# Patient Record
Sex: Female | Born: 1975 | Race: White | Hispanic: No | Marital: Married | Smoking: Never smoker
Health system: Southern US, Community
[De-identification: ages and names within clinical notes are randomized; demographics above are authoritative.]

## PROBLEM LIST (undated history)

## (undated) DIAGNOSIS — D509 Iron deficiency anemia, unspecified: Secondary | ICD-10-CM

## (undated) HISTORY — PX: ABDOMINAL HYSTERECTOMY: SHX81

## (undated) HISTORY — PX: TONSILLECTOMY: SUR1361

---

## 2001-03-27 ENCOUNTER — Other Ambulatory Visit: Admission: RE | Admit: 2001-03-27 | Discharge: 2001-03-27 | Payer: Self-pay | Admitting: Gynecology

## 2003-07-17 ENCOUNTER — Other Ambulatory Visit: Admission: RE | Admit: 2003-07-17 | Discharge: 2003-07-17 | Payer: Self-pay | Admitting: Gynecology

## 2016-03-03 ENCOUNTER — Emergency Department (HOSPITAL_BASED_OUTPATIENT_CLINIC_OR_DEPARTMENT_OTHER): Payer: BLUE CROSS/BLUE SHIELD

## 2016-03-03 ENCOUNTER — Emergency Department (HOSPITAL_BASED_OUTPATIENT_CLINIC_OR_DEPARTMENT_OTHER)
Admission: EM | Admit: 2016-03-03 | Discharge: 2016-03-03 | Disposition: A | Discharge status: Home / Self Care | Payer: BLUE CROSS/BLUE SHIELD | Attending: Emergency Medicine | Admitting: Emergency Medicine

## 2016-03-03 ENCOUNTER — Encounter (HOSPITAL_BASED_OUTPATIENT_CLINIC_OR_DEPARTMENT_OTHER): Payer: Self-pay

## 2016-03-03 DIAGNOSIS — M25532 Pain in left wrist: Secondary | ICD-10-CM

## 2016-03-03 DIAGNOSIS — Z79899 Other long term (current) drug therapy: Secondary | ICD-10-CM | POA: Insufficient documentation

## 2016-03-03 HISTORY — DX: Iron deficiency anemia, unspecified: D50.9

## 2016-03-03 MED ORDER — IBUPROFEN 800 MG PO TABS: 800.0000 mg | ORAL_TABLET | Freq: Once | ORAL | Status: DC

## 2016-03-03 MED ORDER — OXYCODONE HCL 5 MG PO TABS: 5.0000 mg | ORAL_TABLET | Freq: Once | ORAL | Status: DC

## 2016-03-03 MED ORDER — ACETAMINOPHEN 500 MG PO TABS: 1000.0000 mg | ORAL_TABLET | Freq: Once | ORAL | Status: DC

## 2016-03-03 NOTE — Discharge Instructions (Signed)
You have pain over the scaphoid bone. Sometimes this can be fractured and hard to see on x-ray. Follow-up with a hand surgeon in one week.  Take 4 over the counter ibuprofen tablets 3 times a day or 2 over-the-counter naproxen tablets twice a day for pain. Also take tylenol 1000mg (2 extra strength) four times a day.

## 2016-03-03 NOTE — ED Notes (Signed)
No changes, pt declined pain meds, states, "did not need at this time, have meds at home", "ready to go", pending splint, family at [facility] x2, denies questions or needs.

## 2016-03-03 NOTE — ED Notes (Signed)
CMS intact before and after. Pt tolerated well. Pt had no questions.  

## 2016-03-03 NOTE — ED Provider Notes (Signed)
[facility] Provider Note   CSN: [id number] Arrival date & time: 03/03/16  4098  First Provider Contact:  First MD Initiated Contact with Patient 03/03/16 2104     By signing my name below, I, Soijett Blue, attest that this documentation has been prepared under the direction and in the presence of Melene Plan, DO. Electronically Signed: Soijett Blue, ED Scribe. 03/03/16. 9:17 PM.   History   Chief Complaint Chief Complaint  Patient presents with  . Arm Pain    HPI Elizabeth Strickland is a 40 y.o. female who presents to the Emergency Department complaining of left arm pain onset 5 hours ago. Pt notes that she was in the [place] airport when she slipped and fell on a wet floor onto her left side. Pt is having associated symptoms of left hand pain, left wrist pain/swelling, and left forearm pain. She notes that she has tried ice and ibuprofen with no relief of her symptoms. She denies hitting her head, LOC, color change, wound, rash, and any other symptoms.    The history is provided by the patient. No language interpreter was used.  Arm Pain  This is a new problem. The current episode started 3 to 5 hours ago. The problem occurs rarely. The problem has not changed since onset.Pertinent negatives include no chest pain, no headaches and no shortness of breath. The symptoms are aggravated by bending. Nothing relieves the symptoms. She has tried a cold compress (ibuprofen) for the symptoms. The treatment provided no relief.    Past Medical History:  Diagnosis Date  . Iron (Fe) deficiency anemia     There are no active problems to display for this patient.   Past Surgical History:  Procedure Laterality Date  . ABDOMINAL HYSTERECTOMY    . TONSILLECTOMY      OB History    No data available       Home Medications    Prior to Admission medications   Medication Sig Start Date End Date Taking? Authorizing Provider  ferrous sulfate 325 (65 FE) MG tablet Take 325 mg by mouth  daily with breakfast.   Yes Historical Provider, MD  Nutritional Supplements (VITAMIN D BOOSTER PO) Take by mouth.   Yes Historical Provider, MD    Family History History reviewed. No pertinent family history.  Social History Social History  Substance Use Topics  . Smoking status: Never Smoker  . Smokeless tobacco: Never Used  . Alcohol use No     Allergies   Codeine   Review of Systems Review of Systems  Constitutional: Negative for chills and fever.  HENT: Negative for congestion and rhinorrhea.   Eyes: Negative for redness and visual disturbance.  Respiratory: Negative for shortness of breath and wheezing.   Cardiovascular: Negative for chest pain and palpitations.  Gastrointestinal: Negative for nausea and vomiting.  Genitourinary: Negative for dysuria and urgency.  Musculoskeletal: Positive for arthralgias (left hand, left wrist, and left forearm) and joint swelling (left wrist). Negative for myalgias.  Skin: Negative for color change, pallor, rash and wound.  Neurological: Negative for dizziness and headaches.  All other systems reviewed and are negative.    Physical Exam Updated Vital Signs BP (!) 112/53 (BP Location: Right Arm)   Pulse 76   Temp 98.3 F (36.8 C) (Oral)   Resp 16   Ht 5\' 6"  (1.676 m)   Wt 200 lb (90.7 kg)   SpO2 100%   BMI 32.28 kg/m   Physical Exam  Constitutional: She is oriented  to person, place, and time. She appears well-developed and well-nourished. No distress.  HENT:  Head: Normocephalic and atraumatic.  Eyes: EOM are normal. Pupils are equal, round, and reactive to light.  Neck: Normal range of motion. Neck supple.  Cardiovascular: Normal rate.   Pulmonary/Chest: Effort normal.  Abdominal: She exhibits no distension.  Musculoskeletal: She exhibits no edema.       Left wrist: She exhibits tenderness and swelling.  Positive Finkelstein test. Tenderness about the left extensor pollicis brevis ligament. Tenderness about the  snuff box with mild swelling. Pulse motor and sensation intact distally.   Neurological: She is alert and oriented to person, place, and time.  Skin: Skin is warm and dry. She is not diaphoretic.  Psychiatric: She has a normal mood and affect. Her behavior is normal.  Nursing note and vitals reviewed.    ED Treatments / Results  DIAGNOSTIC STUDIES: Oxygen Saturation is 100% on RA, nl by my interpretation.    COORDINATION OF CARE: 9:06 PM Discussed treatment plan with pt at bedside which includes left forearm xray, left wrist xray, left hand xray, splint, referral and follow up with hand surgeon, and pt agreed to plan.  Radiology Dg Forearm Left  Result Date: 03/03/2016 CLINICAL DATA:  Fall, pain and swelling to lateral aspect of left wrist and base of thumb. EXAM: LEFT FOREARM - 2 VIEW COMPARISON:  None. FINDINGS: Osseous alignment is normal. Bone mineralization is normal. No fracture line or displaced fracture fragment identified. Adjacent soft tissues are unremarkable. IMPRESSION: Negative. Electronically Signed   By: Bary Richard M.D.   On: 03/03/2016 20:39  Dg Wrist Complete Left  Result Date: 03/03/2016 CLINICAL DATA:  Fall, pain and swelling to lateral aspect of left wrist and base of thumb. EXAM: LEFT WRIST - COMPLETE 3+ VIEW COMPARISON:  None. FINDINGS: Osseous alignment is normal. Bone mineralization is normal. No fracture line or displaced fracture fragment identified. Adjacent soft tissues are unremarkable. IMPRESSION: Negative. Electronically Signed   By: Bary Richard M.D.   On: 03/03/2016 20:37  Dg Hand Complete Left  Result Date: 03/03/2016 CLINICAL DATA:  Fall, pain and swelling to lateral aspect of left wrist and base of thumb. EXAM: LEFT HAND - COMPLETE 3+ VIEW COMPARISON:  None. FINDINGS: Osseous alignment is normal. Bone mineralization is normal. No fracture line or displaced fracture fragment identified. Soft tissues about the right hand are unremarkable. IMPRESSION:  Negative. Electronically Signed   By: Bary Richard M.D.   On: 03/03/2016 20:38   Procedures Procedures (including critical care time)  Medications Ordered in ED Medications  acetaminophen (TYLENOL) tablet 1,000 mg (1,000 mg Oral Refused 03/03/16 2134)  ibuprofen (ADVIL,MOTRIN) tablet 800 mg (800 mg Oral Refused 03/03/16 2134)  oxyCODONE (Oxy IR/ROXICODONE) immediate release tablet 5 mg (5 mg Oral Refused 03/03/16 2134)     Initial Impression / Assessment and Plan / ED Course  I have reviewed the triage vital signs and the nursing notes.  Pertinent imaging results that were available during my care of the patient were reviewed by me and considered in my medical decision making (see chart for details).  Clinical Course    40 yo F With a chief complaint of a fall on outstretched hand. [place] Tablet from standing. Patient having some significant pain to the wrist. The patient's area of pain is most likely inflammation of the EPL, however the patient also has snuffbox tenderness and swelling. We'll place her in a thumb spica  for follow-up with hand surgery.  10:34 PM:  I have discussed the diagnosis/risks/treatment options with the patient and believe the pt to be eligible for discharge home to follow-up with Hand surgery. We also discussed returning to the ED immediately if new or worsening sx occur. We discussed the sx which are most concerning (e.g., sudden worsening pain) that necessitate immediate return. Medications administered to the patient during their visit and any new prescriptions provided to the patient are listed below.  Medications given during this visit Medications  acetaminophen (TYLENOL) tablet 1,000 mg (1,000 mg Oral Refused 03/03/16 2134)  ibuprofen (ADVIL,MOTRIN) tablet 800 mg (800 mg Oral Refused 03/03/16 2134)  oxyCODONE (Oxy IR/ROXICODONE) immediate release tablet 5 mg (5 mg Oral Refused 03/03/16 2134)     The patient appears  reasonably screen and/or stabilized for discharge and I doubt any other medical condition or other [facility] requiring further screening, evaluation, or treatment in the ED at this time prior to discharge.    Final Clinical Impressions(s) / ED Diagnoses   Final diagnoses:  Left wrist pain    New Prescriptions Discharge Medication List as of 03/03/2016  9:19 PM      I personally performed the services described in this documentation, which was scribed in my presence. The recorded information has been reviewed and is accurate.     Melene Plan, DO 03/03/16 2234

## 2016-03-03 NOTE — ED Triage Notes (Addendum)
At 1545, pt reports she slipped and fell on a wet floor at [facility]. Pt has filed a claim with them. Pt denies hitting head or syncope. PT with left hand/wrist/forearm deformity. Ice alleviated pain. Ibuprofen at 1630.

## 2016-12-12 IMAGING — DX DG FOREARM 2V*L*
2 series · 2 of 2 positions shown · non-contrast
Comparison: None.

CLINICAL DATA: Fall, pain and swelling to lateral aspect of left
wrist and base of thumb.

EXAM:
LEFT FOREARM - 2 VIEW

[forearm ap]
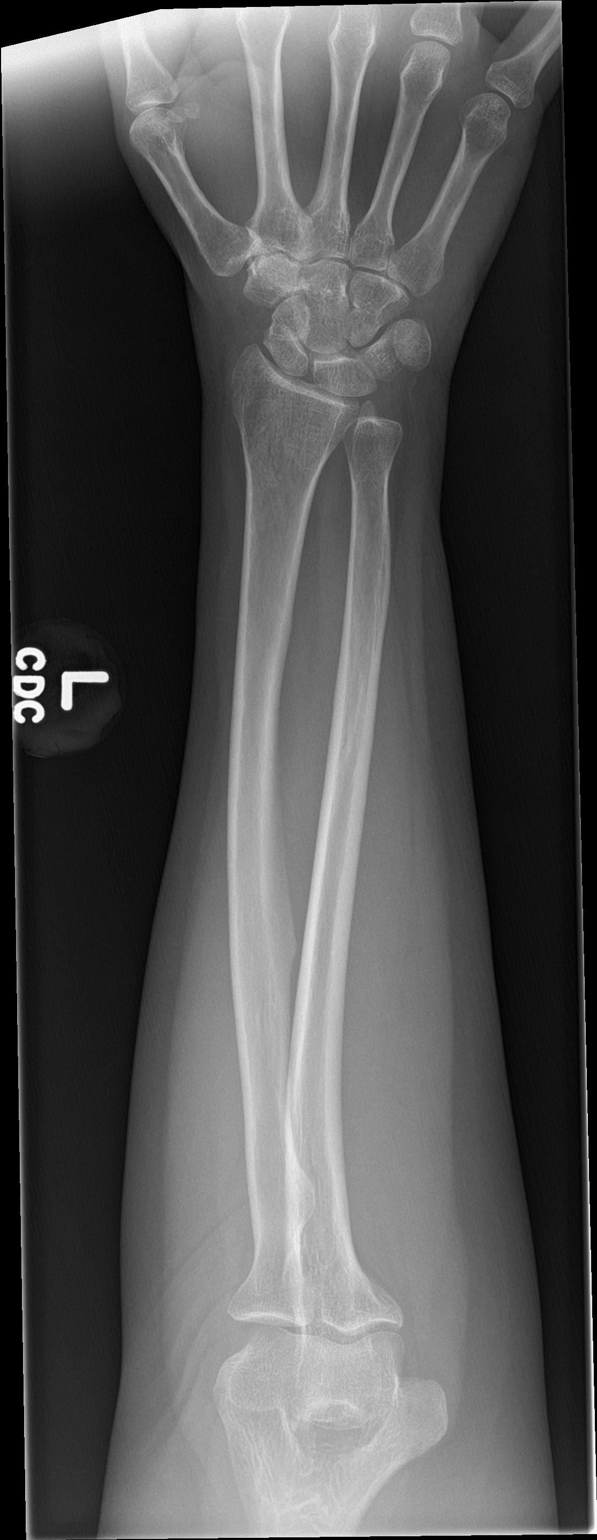

[forearm lat]
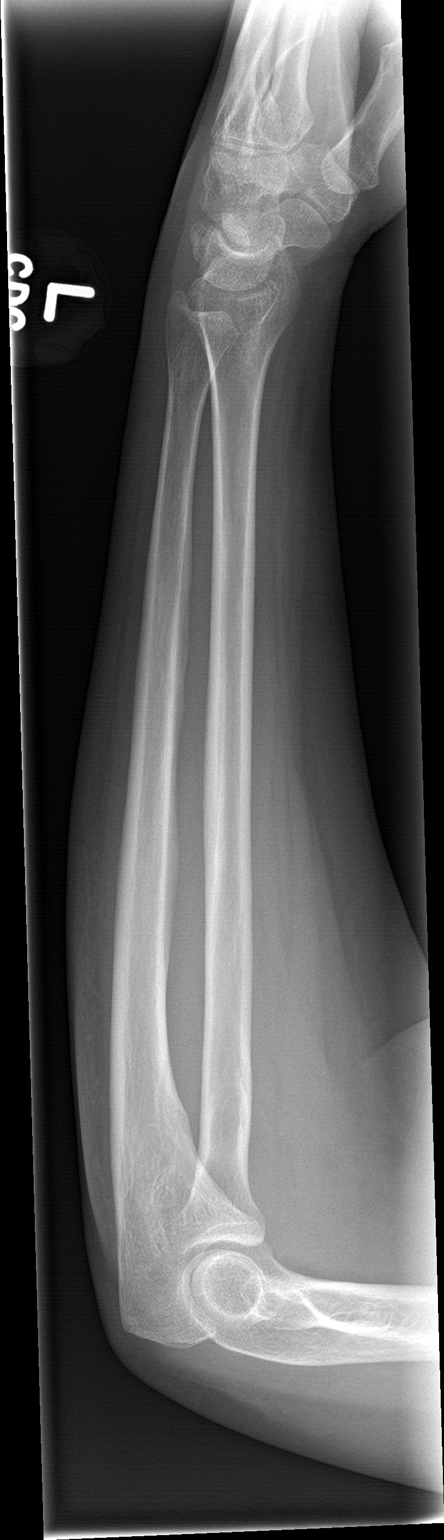

[2 of 2 positions shown; findings below may reference images not displayed]

FINDINGS: Osseous alignment is normal. Bone mineralization is normal. No
fracture line or displaced fracture fragment identified. Adjacent
soft tissues are unremarkable.
IMPRESSION: Negative.

## 2016-12-12 IMAGING — DX DG HAND COMPLETE 3+V*L*
3 series · 3 of 3 positions shown · non-contrast
Comparison: None.

CLINICAL DATA: Fall, pain and swelling to lateral aspect of left
wrist and base of thumb.

EXAM:
LEFT HAND - COMPLETE 3+ VIEW

[hand pa]
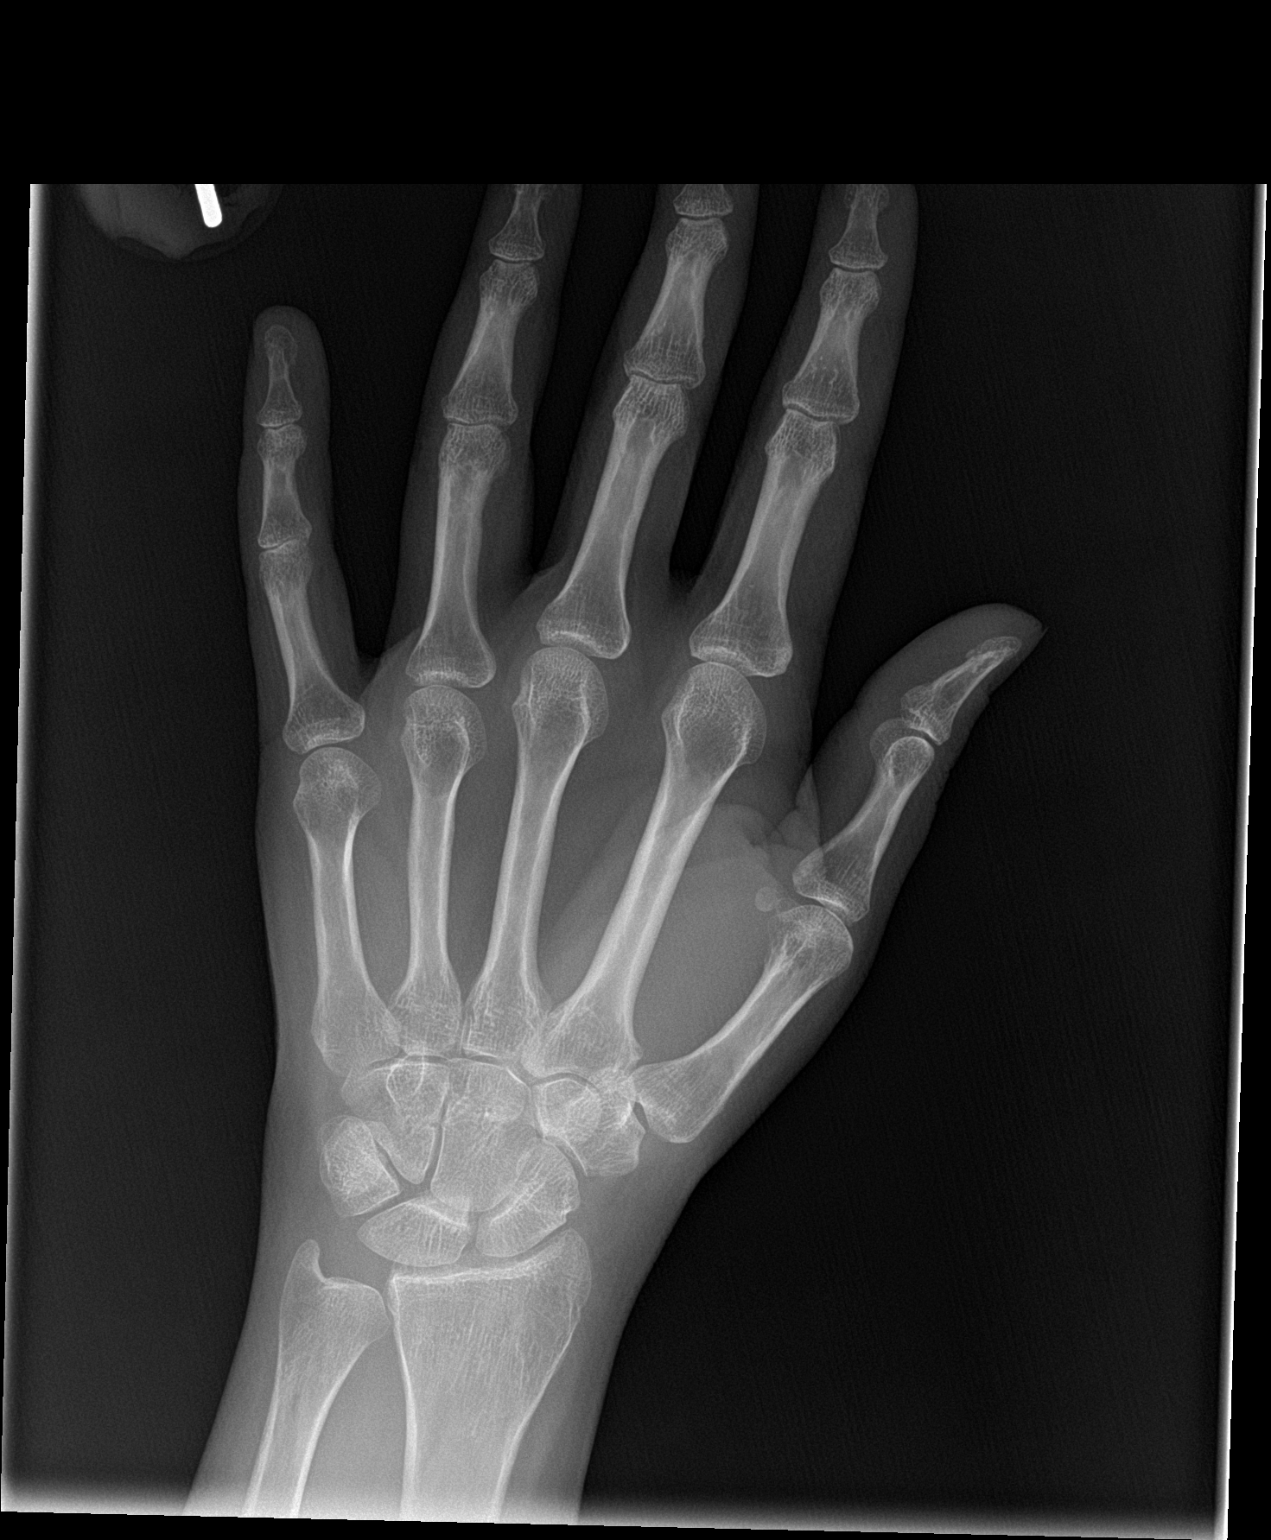

[hand obl]
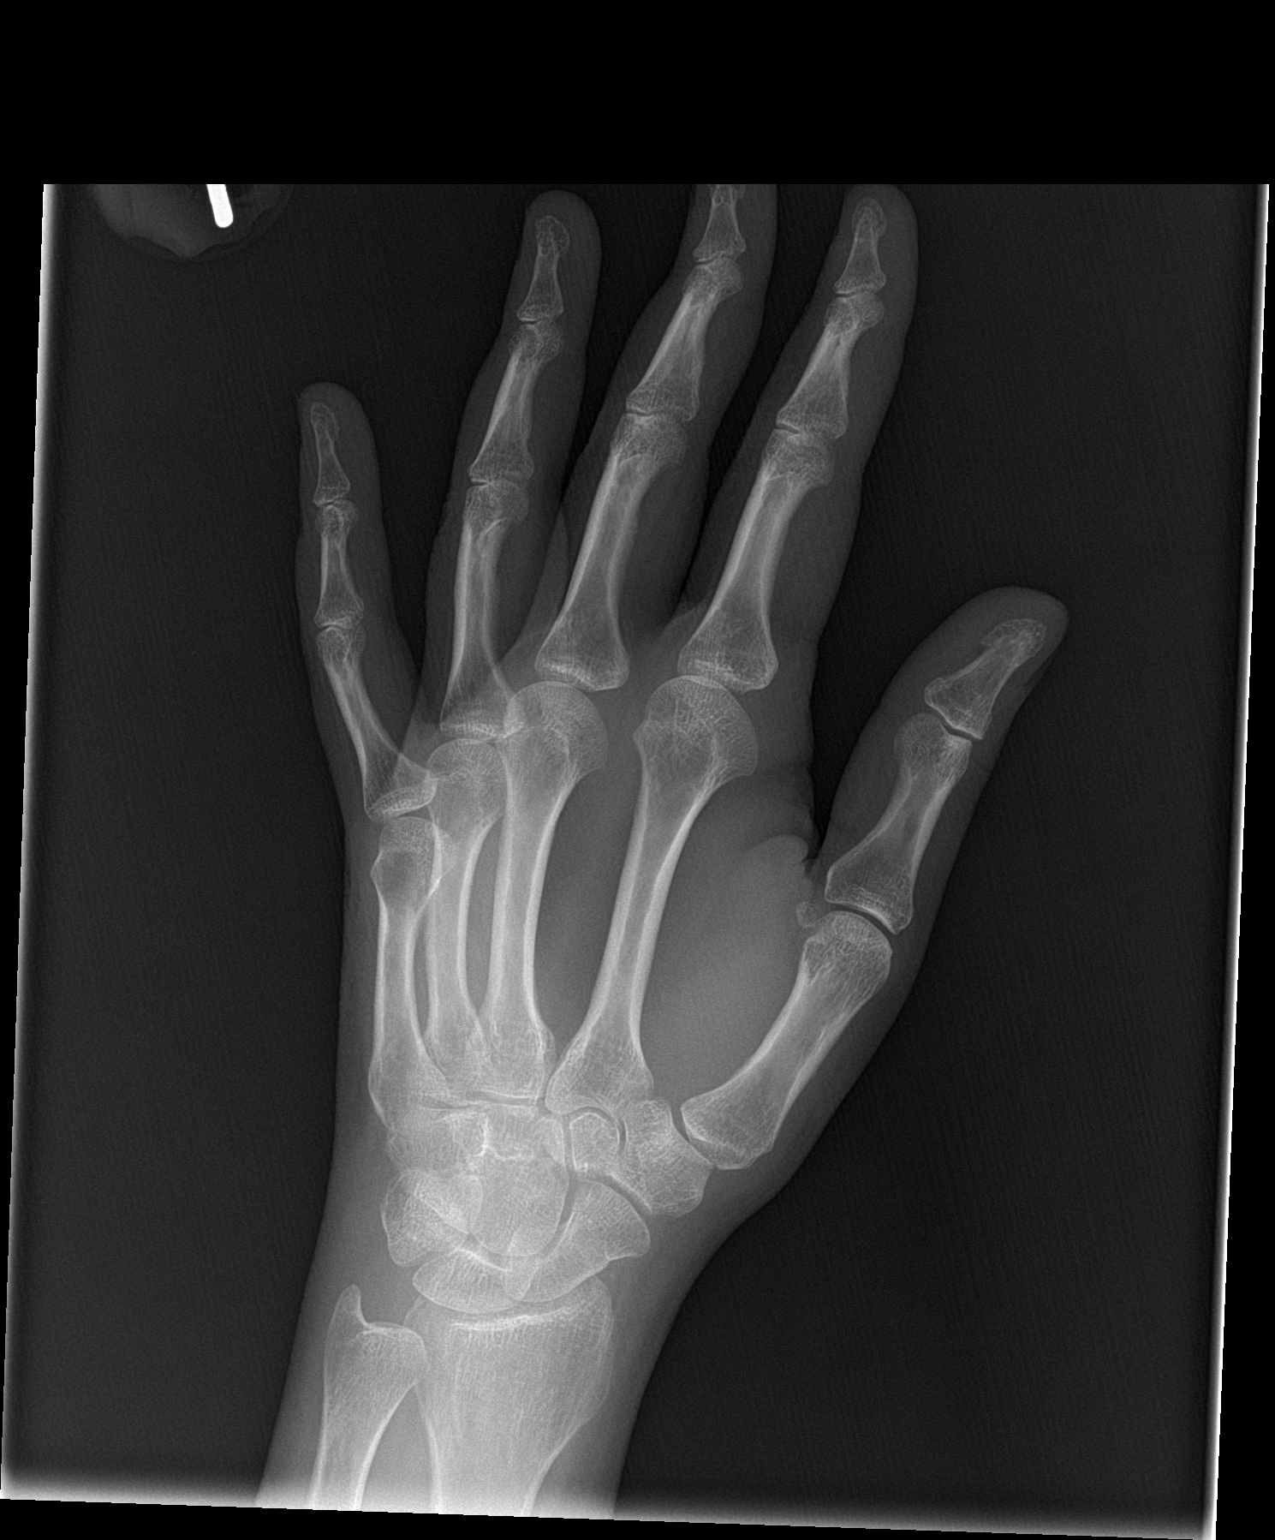

[hand lat]
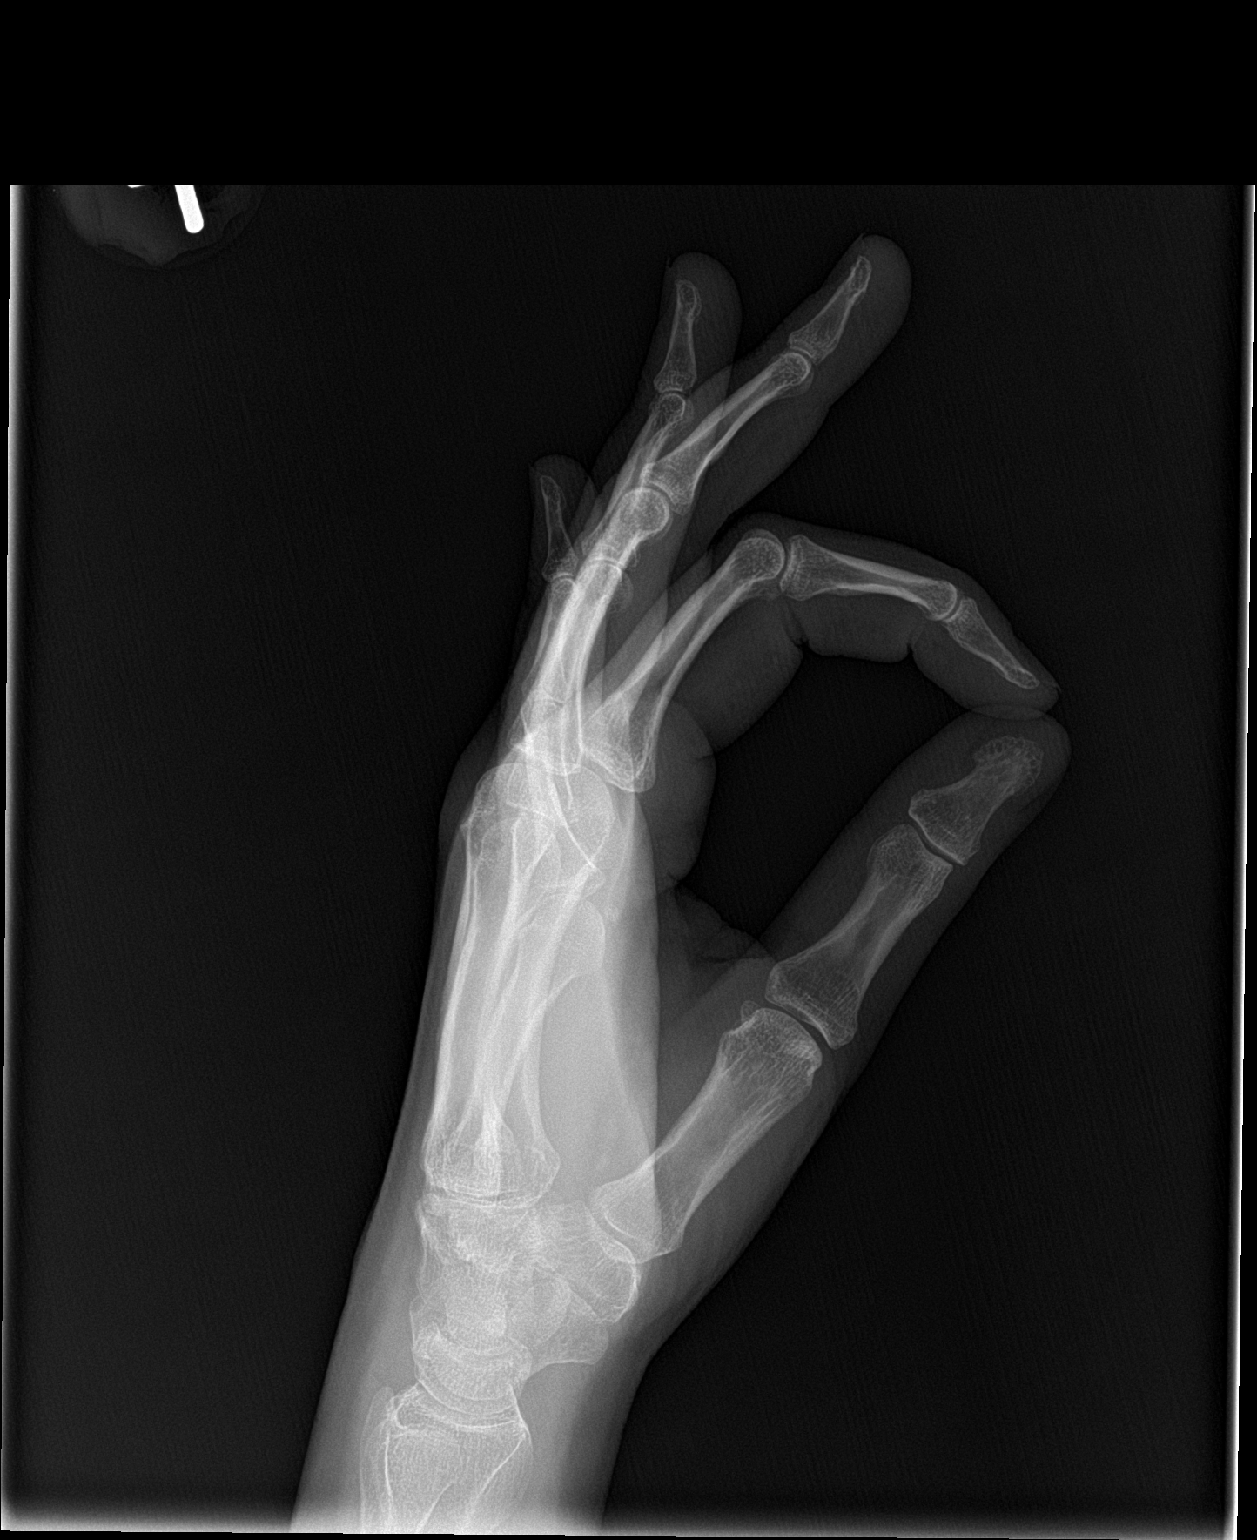

[3 of 3 positions shown; findings below may reference images not displayed]

FINDINGS: Osseous alignment is normal. Bone mineralization is normal. No
fracture line or displaced fracture fragment identified. Soft
tissues about the right hand are unremarkable.
IMPRESSION: Negative.
